# Patient Record
Sex: Female | Born: 1995
Health system: Southern US, Community
[De-identification: ages and names within clinical notes are randomized; demographics above are authoritative.]

## PROBLEM LIST (undated history)

## (undated) ENCOUNTER — Ambulatory Visit (HOSPITAL_BASED_OUTPATIENT_CLINIC_OR_DEPARTMENT_OTHER): Admission: EM | Payer: MEDICAID

## (undated) DIAGNOSIS — F1991 Other psychoactive substance use, unspecified, in remission: Secondary | ICD-10-CM

## (undated) DIAGNOSIS — B1920 Unspecified viral hepatitis C without hepatic coma: Secondary | ICD-10-CM

## (undated) DIAGNOSIS — H9190 Unspecified hearing loss, unspecified ear: Secondary | ICD-10-CM

## (undated) HISTORY — DX: Other psychoactive substance use, unspecified, in remission: F19.91

## (undated) HISTORY — PX: APPENDECTOMY: SHX54

## (undated) HISTORY — DX: Unspecified hearing loss, unspecified ear: H91.90

## (undated) HISTORY — PX: MIDDLE EAR SURGERY: SHX713

## (undated) HISTORY — DX: Unspecified viral hepatitis C without hepatic coma: B19.20

---
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
## 2020-11-12 ENCOUNTER — Other Ambulatory Visit: Payer: Self-pay

## 2020-11-12 ENCOUNTER — Ambulatory Visit: Payer: Medicaid Other | Admitting: *Deleted

## 2020-11-12 VITALS — BP 110/64 | Wt 149.8 lb

## 2020-11-12 DIAGNOSIS — R87610 Atypical squamous cells of undetermined significance on cytologic smear of cervix (ASC-US): Secondary | ICD-10-CM

## 2020-11-12 DIAGNOSIS — Z1239 Encounter for other screening for malignant neoplasm of breast: Secondary | ICD-10-CM

## 2020-11-12 NOTE — Patient Instructions (Signed)
Explained breast self awareness with [name]. Patient did not need a Pap smear today due to last Pap smear was 09/30/2020. Explained the colposcopy the recommended follow-up for her abnormal Pap smear. Referred patient to the [facility] for [facility] for a colposcopy to follow-up for her abnormal Pap smear. Appointment scheduled Monday, December 09, 2020 at 1315. Patient aware of appointment and will be there. Let patient know a screening mammogram is recommended at age 25 unless clinically indicated prior. Discussed smoking cessation with patient. Referred to the [facility] Quitline and gave resources to the free smoking cessation classes at [facility]. Nicole Howe verbalized understanding.  Chinita Schimpf, Kathaleen Maser, RN 3:13 PM

## 2020-11-12 NOTE — Progress Notes (Signed)
Nicole Howe is a 25 y.o. female who presents to [facility] clinic today with no complaints. Patient referred to BCCCP by the [facility] Department due to having an abnormal Pap smear 09/30/2020 that a colposcopy is recommended for follow-up.   Pap Smear: Pap smear not completed today. Last Pap smear was 09/30/2020 at the [facility] clinic and was ASCUS with positive HPV. Per patient has history of an abnormal Pap smear in November 2020 that was ASCUS with positive HPV that a follow-up Pap smear in one year was completed that was patients most recent Pap smear. Last Pap smear result is available in Epic.   Physical exam: Breasts Breasts symmetrical. No skin abnormalities bilateral breasts. No nipple retraction bilateral breasts. No nipple discharge bilateral breasts. No lymphadenopathy. No lumps palpated bilateral breasts. No complaints of pain or tenderness on exam. Screening mammogram recommended at age 73 unless clinically indicated prior.       Pelvic/Bimanual Pap is not indicated today per BCCCP guidelines.    Smoking History: Patient is a current smoker. Discussed smoking cessation with patient. Referred to the [facility] Quitline and gave resources to the free smoking cessation classes at [facility].   Patient Navigation: Patient education provided. Access to services provided for patient through BCCCP program.   Breast and Cervical Cancer Risk Assessment: Patient does not have family history of breast cancer, known genetic mutations, or radiation treatment to the chest before age 34. Patient does not have history of cervical dysplasia, immunocompromised, or DES exposure in-utero. Breast cancer risk assessment completed. No breast cancer risk calculated due to patient is less than 61 years old.  Risk Assessment    Risk Scores      11/12/2020   Last edited by: Priscille Heidelberg, RN   5-year risk:    Lifetime risk:           A: BCCCP exam without pap  smear No complaints.  P: Referred patient to the [facility] for [facility] for a colposcopy to follow-up for her abnormal Pap smear. Appointment scheduled Monday, December 09, 2020 at 1315.  Priscille Heidelberg, RN 11/12/2020 3:13 PM

## 2020-12-09 ENCOUNTER — Other Ambulatory Visit: Payer: Self-pay

## 2020-12-09 ENCOUNTER — Encounter: Payer: Self-pay | Admitting: Family Medicine

## 2020-12-09 ENCOUNTER — Ambulatory Visit (INDEPENDENT_AMBULATORY_CARE_PROVIDER_SITE_OTHER): Payer: Medicaid Other | Admitting: Family Medicine

## 2020-12-09 ENCOUNTER — Other Ambulatory Visit (HOSPITAL_COMMUNITY)
Admission: RE | Admit: 2020-12-09 | Discharge: 2020-12-09 | Disposition: A | Discharge status: Home / Self Care | Payer: Medicaid Other | Source: Ambulatory Visit | Attending: Family Medicine | Admitting: Family Medicine

## 2020-12-09 VITALS — BP 119/79 | HR 66 | Wt 154.7 lb

## 2020-12-09 DIAGNOSIS — R87610 Atypical squamous cells of undetermined significance on cytologic smear of cervix (ASC-US): Secondary | ICD-10-CM | POA: Insufficient documentation

## 2020-12-09 DIAGNOSIS — R87810 Cervical high risk human papillomavirus (HPV) DNA test positive: Secondary | ICD-10-CM | POA: Insufficient documentation

## 2020-12-09 DIAGNOSIS — Z3202 Encounter for pregnancy test, result negative: Secondary | ICD-10-CM

## 2020-12-09 LAB — POCT PREGNANCY, URINE: Preg Test, Ur: NEGATIVE

## 2020-12-09 NOTE — Progress Notes (Signed)
    GYNECOLOGY OFFICE COLPOSCOPY PROCEDURE NOTE  25 y.o. No obstetric history on file. here for colposcopy for ASCUS with POSITIVE high risk HPV pap smear on 09/30/2020 and prior ACUS HPV in 06/2019. Marland Kitchen Discussed role for HPV in cervical dysplasia, need for surveillance.  Patient gave informed written consent, time out was performed.  Placed in lithotomy position. Cervix viewed with speculum and colposcope after application of acetic acid.   Colposcopy adequate? Yes  acetowhite lesion(s) noted circumferentially around os, most dense at 9 and 12 o'clock; corresponding biopsies obtained.  ECC specimen obtained. All specimens were labeled and sent to pathology.  Patient was given post procedure instructions.  Will follow up pathology and manage accordingly; patient will be contacted with results and recommendations.  Routine preventative health maintenance measures emphasized.   Orders Placed This Encounter  Procedures  . Pregnancy, urine POC    Federico Flake, MD, MPH, ABFM, [facility] Attending Physician Center for [facility]

## 2020-12-11 LAB — SURGICAL PATHOLOGY

## 2020-12-16 ENCOUNTER — Telehealth: Payer: Self-pay

## 2020-12-16 ENCOUNTER — Telehealth: Payer: Self-pay | Admitting: General Practice

## 2020-12-16 NOTE — Telephone Encounter (Signed)
-----   Message from Federico Flake, MD sent at 12/16/2020  9:36 AM EDT ----- No dysplasia.  Needs repeat pap in 1 year with HPV cotesting.

## 2020-12-16 NOTE — Telephone Encounter (Signed)
Left detailed message with negative results per Dr Alvester Morin. Pt advised to return call to office with any questions or concerns.   Judeth Cornfield, RN  12/16/20

## 2020-12-16 NOTE — Telephone Encounter (Signed)
Patient called and left message on nurse voicemail stating she is returning our phone call for results. Called patient, no answer- left message to call us back for results.

## 2020-12-17 NOTE — Telephone Encounter (Signed)
Returned patients call. Patient did not answer. LM for patient to call the office for results at her convenience.   Letter created and mailed to patient.

## 2024-04-20 DIAGNOSIS — F1111 Opioid abuse, in remission: Secondary | ICD-10-CM | POA: Insufficient documentation

## 2024-04-21 DIAGNOSIS — R7689 Other specified abnormal immunological findings in serum: Secondary | ICD-10-CM | POA: Insufficient documentation

## 2024-04-26 LAB — PANORAMA PRENATAL TEST FULL PANEL:PANORAMA TEST PLUS 5 ADDITIONAL MICRODELETIONS: FETAL FRACTION: 10.9

## 2024-04-27 ENCOUNTER — Other Ambulatory Visit (HOSPITAL_BASED_OUTPATIENT_CLINIC_OR_DEPARTMENT_OTHER): Payer: Self-pay

## 2024-04-27 ENCOUNTER — Encounter (HOSPITAL_BASED_OUTPATIENT_CLINIC_OR_DEPARTMENT_OTHER): Payer: Self-pay

## 2024-04-27 ENCOUNTER — Ambulatory Visit (HOSPITAL_BASED_OUTPATIENT_CLINIC_OR_DEPARTMENT_OTHER)
Admission: EM | Admit: 2024-04-27 | Discharge: 2024-04-27 | Disposition: A | Discharge status: Home / Self Care | Payer: MEDICAID | Attending: Family Medicine | Admitting: Family Medicine

## 2024-04-27 DIAGNOSIS — K047 Periapical abscess without sinus: Secondary | ICD-10-CM

## 2024-04-27 MED ORDER — CHLORHEXIDINE GLUCONATE 0.12 % MT SOLN
15.0000 mL | Freq: Two times a day (BID) | OROMUCOSAL | 0 refills | Status: AC
  Filled 2024-04-27: qty 473, 16d supply, fill #0

## 2024-04-27 MED ORDER — AMOXICILLIN 875 MG PO TABS
875.0000 mg | ORAL_TABLET | Freq: Two times a day (BID) | ORAL | 0 refills | Status: AC
  Filled 2024-04-27: qty 14, 7d supply, fill #0

## 2024-04-27 NOTE — ED Triage Notes (Signed)
 Pt has several cavities in mouth and for past 2 years she's been getting them extracted, she has a tooth that broke off over a year ago on the bottom left side. Pt has been experiencing worsening pain since Friday but can't get into see a dentist till next week to see a dentist. She is concerned about an infection, particularly due to being [redacted] weeks pregnant. Pt took tylenol two days ago with moderate relief.

## 2024-04-27 NOTE — ED Provider Notes (Signed)
 Nicole Howe    CSN: [id number] Arrival date & time: 04/27/24  1249      History   Chief Complaint Chief Complaint  Patient presents with   Dental Pain   Oral Swelling    HPI Nicole Howe is a 28 y.o. female.   Patient is a 28 year old female who presents today with dental pain.  Pt has several cavities in mouth and for past 2 years she's been getting them extracted. She has a tooth that broke off over a year ago on the bottom left side. Pt has been experiencing worsening pain since Friday but can't get into see a dentist till next week to see a dentist. She is concerned about an infection, particularly due to being [redacted] weeks pregnant. Pt took tylenol two days ago with moderate relief.     Dental Pain   Past Medical History:  Diagnosis Date   Hearing loss     Patient Active Problem List   Diagnosis Date Noted   ASCUS with positive high risk HPV cervical 12/09/2020    Past Surgical History:  Procedure Laterality Date   APPENDECTOMY     MIDDLE EAR SURGERY      OB History     Gravida  3   Para  0   Term  0   Preterm  0   AB  2   Living  0      SAB  2   IAB  0   Ectopic  0   Multiple  0   Live Births  0            Home Medications    Prior to Admission medications   Medication Sig Start Date End Date Taking? Authorizing Provider  amoxicillin  (AMOXIL ) 875 MG tablet Take 1 tablet (875 mg total) by mouth 2 (two) times daily for 7 days. 04/27/24 05/04/24 Yes Regan Mcbryar A, FNP  chlorhexidine  (PERIDEX ) 0.12 % solution Use as directed (rinse and spit) 15 mLs in the mouth or throat 2 (two) times daily. 04/27/24  Yes Adah Wilbert LABOR, FNP    Family History Family History  Problem Relation Age of Onset   Hypertension Mother    Cervical cancer Maternal Aunt     Social History Social History   Tobacco Use   Smoking status: Former   Smokeless tobacco: Never   Tobacco comments:    2 cigarettes per day/trying to quit  Vaping Use    Vaping status: Never Used  Substance Use Topics   Alcohol use: Not Currently   Drug use: Not Currently     Allergies   Patient has no known allergies.   Review of Systems Review of Systems  See HPI Physical Exam Triage Vital Signs ED Triage Vitals  Encounter Vitals Group     BP 04/27/24 1317 138/86     Girls Systolic BP Percentile --      Girls Diastolic BP Percentile --      Boys Systolic BP Percentile --      Boys Diastolic BP Percentile --      Pulse Rate 04/27/24 1317 (!) 56     Resp 04/27/24 1317 20     Temp 04/27/24 1317 98.2 F (36.8 C)     Temp src --      SpO2 04/27/24 1317 98 %     Weight --      Height --      Head Circumference --      Peak  Flow --      Pain Score 04/27/24 1320 6     Pain Loc --      Pain Education --      Exclude from Growth Chart --    No data found.  Updated Vital Signs BP 138/86 (BP Location: Right Arm)   Pulse (!) 56   Temp 98.2 F (36.8 C)   Resp 20   SpO2 98%   Visual Acuity Right Eye Distance:   Left Eye Distance:   Bilateral Distance:    Right Eye Near:   Left Eye Near:    Bilateral Near:     Physical Exam Vitals and nursing note reviewed.  Constitutional:      General: She is not in acute distress.    Appearance: Normal appearance. She is not ill-appearing, toxic-appearing or diaphoretic.  HENT:     Mouth/Throat:     Dentition: Dental tenderness, gingival swelling, dental caries and dental abscesses present.  Pulmonary:     Effort: Pulmonary effort is normal.  Neurological:     Mental Status: She is alert.  Psychiatric:        Mood and Affect: Mood normal.      UC Treatments / Results  Labs (all labs ordered are listed, but only abnormal results are displayed) Labs Reviewed - No data to display  EKG   Radiology No results found.  Procedures Procedures (including critical Howe time)  Medications Ordered in UC Medications - No data to display  Initial Impression / Assessment and Plan / UC  Course  I have reviewed the triage vital signs and the nursing notes.  Pertinent labs & imaging results that were available during my Howe of the patient were reviewed by me and considered in my medical decision making (see chart for details).     Dental infection- treating with Amoxicillin . Peridex  mouthwash as prescribed.  See dentist for F/U as planned and extractions.  Final Clinical Impressions(s) / UC Diagnoses   Final diagnoses:  Dental infection     Discharge Instructions      Treating you for a dental infection.  Antibiotics as prescribed.  Mouthwash as prescribed.  Follow-up with a dentist as planned   ED Prescriptions     Medication Sig Dispense Auth. Provider   amoxicillin  (AMOXIL ) 875 MG tablet Take 1 tablet (875 mg total) by mouth 2 (two) times daily for 7 days. 14 tablet Pallie Swigert A, FNP   chlorhexidine  (PERIDEX ) 0.12 % solution Use as directed (rinse and spit) 15 mLs in the mouth or throat 2 (two) times daily. 473 mL Adah Corning A, FNP      PDMP not reviewed this encounter.   Adah Corning LABOR, FNP 04/28/24 1549

## 2024-04-27 NOTE — Discharge Instructions (Addendum)
 Treating you for a dental infection.  Antibiotics as prescribed.  Mouthwash as prescribed.  Follow-up with a dentist as planned

## 2024-05-08 ENCOUNTER — Ambulatory Visit (HOSPITAL_BASED_OUTPATIENT_CLINIC_OR_DEPARTMENT_OTHER): Payer: Self-pay | Admitting: Family Medicine

## 2024-05-22 DIAGNOSIS — B1920 Unspecified viral hepatitis C without hepatic coma: Secondary | ICD-10-CM | POA: Insufficient documentation

## 2024-05-29 ENCOUNTER — Ambulatory Visit (INDEPENDENT_AMBULATORY_CARE_PROVIDER_SITE_OTHER): Payer: MEDICAID | Admitting: Student

## 2024-05-29 ENCOUNTER — Encounter (HOSPITAL_BASED_OUTPATIENT_CLINIC_OR_DEPARTMENT_OTHER): Payer: Self-pay | Admitting: Student

## 2024-05-29 VITALS — BP 122/75 | HR 63 | Temp 98.4°F | Resp 16 | Ht 63.78 in | Wt 168.2 lb

## 2024-05-29 DIAGNOSIS — Z3A16 16 weeks gestation of pregnancy: Secondary | ICD-10-CM | POA: Insufficient documentation

## 2024-05-29 DIAGNOSIS — O98412 Viral hepatitis complicating pregnancy, second trimester: Secondary | ICD-10-CM

## 2024-05-29 DIAGNOSIS — F1111 Opioid abuse, in remission: Secondary | ICD-10-CM | POA: Insufficient documentation

## 2024-05-29 DIAGNOSIS — R300 Dysuria: Secondary | ICD-10-CM | POA: Insufficient documentation

## 2024-05-29 DIAGNOSIS — B189 Chronic viral hepatitis, unspecified: Secondary | ICD-10-CM | POA: Insufficient documentation

## 2024-05-29 DIAGNOSIS — Z130 Encounter for screening for diseases of the blood and blood-forming organs and certain disorders involving the immune mechanism: Secondary | ICD-10-CM

## 2024-05-29 DIAGNOSIS — Z7689 Persons encountering health services in other specified circumstances: Secondary | ICD-10-CM

## 2024-05-29 DIAGNOSIS — R42 Dizziness and giddiness: Secondary | ICD-10-CM | POA: Diagnosis not present

## 2024-05-29 LAB — POCT URINALYSIS DIP (CLINITEK)
Bilirubin, UA: NEGATIVE
Blood, UA: NEGATIVE
Glucose, UA: NEGATIVE mg/dL
Ketones, POC UA: NEGATIVE mg/dL
Leukocytes, UA: NEGATIVE
Nitrite, UA: NEGATIVE
POC PROTEIN,UA: NEGATIVE
Spec Grav, UA: >=1.03 — AB (ref 1.010–1.025)
Urobilinogen, UA: 0.2 U/dL
pH, UA: 6 (ref 5.0–8.0)

## 2024-05-29 NOTE — Patient Instructions (Signed)
 It was nice to see you today!  If you have any problems before your next visit feel free to message me via MyChart (minor issues or questions) or call the office, otherwise you may reach out to schedule an office visit.  Thank you! Pau Banh, PA-C

## 2024-05-29 NOTE — Progress Notes (Signed)
 New Patient Office Visit  Subjective    Patient ID: Nicole Howe, female    DOB: Dec 12, 1995  Age: 28 y.o. MRN: [id number]  CC:  Chief Complaint  Patient presents with   Establish Care    Here to establish care. Is four months pregnant.   UTI    Frequent urination and mild back pain.    Dizziness    Has also felt light headed since yesterday but it also happened another time, which only lasted 1 day. It is more worrisome than it has been.    Discussed the use of AI scribe software for clinical note transcription with the patient, who gave verbal consent to proceed.  History of Present Illness   Nicole Howe is a 28 year old female who presents for establishment of care with concerns of possible UTI and back pain.  She is four months pregnant with an expected due date of March 23rd and has been following with maternal fetal medicine. She experiences increased urination and lower left-sided back pain, which she suspects might be related to a urinary tract infection (UTI). A recent urinalysis showed trace leukocyte esterase. She has not taken any medication for the pain but has increased her water intake. The back pain was more severe last week, prompting her to make an appointment, but it has decreased since then.  She has a history of hepatitis C, which is currently dormant. 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 She has been off methadone since April 7th, two years ago. She mentions previous miscarriages but has not experienced any bleeding during this pregnancy.  She reports episodes of lightheadedness and heart racing when changing positions, such as standing up from lying down. These episodes are brief, lasting less than a minute, and have occurred twice during her pregnancy.  She experiences cravings for ice, which she attributes to feeling dehydrated. She is taking prenatal vitamins and is cautious about her intake due to  her recovery. She monitors her caffeine intake, ensuring it does not exceed 200 mg per day. No abnormal discharge or foul smell.      Outpatient Encounter Medications as of 05/29/2024  Medication Sig   chlorhexidine  (PERIDEX ) 0.12 % solution Use as directed (rinse and spit) 15 mLs in the mouth or throat 2 (two) times daily.   Prenatal Vit-DSS-Fe Cbn-FA (PRENATAL AD PO) Take by mouth. Olli chewable- 2 daily   No facility-administered encounter medications on file as of 05/29/2024.    Past Medical History:  Diagnosis Date   Acute appendicitis 04/07/2013   Hearing loss    Hepatitis C    History of drug use     Past Surgical History:  Procedure Laterality Date   APPENDECTOMY     MIDDLE EAR SURGERY     x13    Family History  Problem Relation Age of Onset   Hypertension Mother    Hearing loss Mother    Cervical cancer Maternal Aunt    Mental illness Half-Brother    Ovarian cysts Half-Sister     Social History   Socioeconomic History   Marital status: Married    Spouse name: Not on file   Number of children: 3   Years of education: Not on file   Highest education level: High school graduate  Occupational History   Not on file  Tobacco Use   Smoking status: Former    Current packs/day: 0.00    Average  packs/day: 0.3 packs/day for 15.6 years (3.9 ttl pk-yrs)    Types: Cigarettes    Start date: 2010    Quit date: 03/21/2024    Years since quitting: 0.1    Passive exposure: Past   Smokeless tobacco: Never  Vaping Use   Vaping status: Never Used  Substance and Sexual Activity   Alcohol use: Not Currently   Drug use: Not Currently    Comment: QUIT LESS THAN 30 DAYS 05/29/2024, WAS SMOKING THC   Sexual activity: Yes    Birth control/protection: None  Other Topics Concern   Not on file  Social History Narrative   Not on file   Social Drivers of Health   Financial Resource Strain: Not on file  Food Insecurity: No Food Insecurity (05/29/2024)   Hunger Vital Sign     Worried About Running Out of Food in the Last Year: Never true    Ran Out of Food in the Last Year: Never true  Transportation Needs: No Transportation Needs (05/29/2024)   PRAPARE - Administrator, Civil Service (Medical): No    Lack of Transportation (Non-Medical): No  Physical Activity: Not on file  Stress: Not on file  Social Connections: Not on file  Intimate Partner Violence: Not At Risk (05/29/2024)   Humiliation, Afraid, Rape, and Kick questionnaire    Fear of Current or Ex-Partner: No    Emotionally Abused: No    Physically Abused: No    Sexually Abused: No    ROS  Per HPI      Objective    BP 122/75   Pulse 63   Temp 98.4 F (36.9 C) (Oral)   Resp 16   Ht 5' 3.78 (1.62 m)   Wt 168 lb 3.2 oz (76.3 kg)   LMP 02/10/2024 (Approximate)   SpO2 98%   BMI 29.07 kg/m   Physical Exam Constitutional:      General: She is not in acute distress.    Appearance: Normal appearance. She is not ill-appearing.  HENT:     Head: Normocephalic and atraumatic.     Nose: Nose normal.  Eyes:     General: No scleral icterus.    Conjunctiva/sclera: Conjunctivae normal.  Cardiovascular:     Rate and Rhythm: Normal rate and regular rhythm.     Heart sounds: Normal heart sounds. No murmur heard.    No friction rub.  Pulmonary:     Effort: Pulmonary effort is normal. No respiratory distress.     Breath sounds: Normal breath sounds. No wheezing, rhonchi or rales.  Musculoskeletal:        General: Normal range of motion.  Skin:    General: Skin is warm and dry.     Coloration: Skin is not jaundiced or pale.  Neurological:     Mental Status: She is alert.  Psychiatric:        Mood and Affect: Mood normal.        Behavior: Behavior normal.      No results found for: HGBA1C      Assessment & Plan:   Assessment and Plan    Establishment of Care  Pregnancy, second trimester Currently in the second trimester with a due date of March 23rd. No significant  complications. Reports increased urination and back pain, likely related to pregnancy. Episodes of lightheadedness and heart racing upon standing suggest orthostatic hypotension. Symptoms are transient and resolve within a minute. Blood pressure is normal. - Encourage increased water intake  Suspected urinary  tract infection in pregnancy- dysuria Suspected UTI due to increased urination and back pain. Initial urinalysis negative, but previous urinalysis showed trace leukocyte esterase. Culture pending to confirm diagnosis and guide treatment. - Send urine for culture - Await culture results to determine need for antibiotics  Screening for anemia in pregnancy/lightheadedness Reports symptoms suggestive of possible anemia, including craving ice and feeling cold. No recent anemia screening noted. No alarm symptoms. Discussed that she should follow back up if this continues. - Order CBC and iron panel  Chronic hepatitis C infection in pregnancy Chronic hepatitis C infection identified. Treatment deferred until postpartum due to pregnancy. Monitoring liver function to assess any damage. No current symptoms reported. 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 Previously used methadone for treatment, now off methadone for nearly two years. No current substance use reported.      Return in about 28 weeks (around 12/11/2024), or if symptoms worsen or fail to improve.   Latriece Anstine T Eldrige Pitkin, PA-C

## 2024-05-30 LAB — CBC WITH DIFFERENTIAL/PLATELET
Basophils Absolute: 0 x10E3/uL (ref 0.0–0.2)
Basos: 0 %
EOS (ABSOLUTE): 0.1 x10E3/uL (ref 0.0–0.4)
Eos: 1 %
Hematocrit: 39.8 % (ref 34.0–46.6)
Hemoglobin: 13 g/dL (ref 11.1–15.9)
Immature Grans (Abs): 0 x10E3/uL (ref 0.0–0.1)
Immature Granulocytes: 0 %
Lymphocytes Absolute: 4 x10E3/uL — ABNORMAL HIGH (ref 0.7–3.1)
Lymphs: 33 %
MCH: 30.4 pg (ref 26.6–33.0)
MCHC: 32.7 g/dL (ref 31.5–35.7)
MCV: 93 fL (ref 79–97)
Monocytes Absolute: 0.8 x10E3/uL (ref 0.1–0.9)
Monocytes: 6 %
Neutrophils Absolute: 7.3 x10E3/uL — ABNORMAL HIGH (ref 1.4–7.0)
Neutrophils: 60 %
Platelets: 192 x10E3/uL (ref 150–450)
RBC: 4.28 x10E6/uL (ref 3.77–5.28)
RDW: 13.6 % (ref 11.7–15.4)
WBC: 12.3 x10E3/uL — ABNORMAL HIGH (ref 3.4–10.8)

## 2024-05-30 LAB — IRON,TIBC AND FERRITIN PANEL
Ferritin: 139 ng/mL (ref 15–150)
Iron Saturation: 28 % (ref 15–55)
Iron: 81 ug/dL (ref 27–159)
Total Iron Binding Capacity: 289 ug/dL (ref 250–450)
UIBC: 208 ug/dL (ref 131–425)

## 2024-05-31 ENCOUNTER — Ambulatory Visit (HOSPITAL_BASED_OUTPATIENT_CLINIC_OR_DEPARTMENT_OTHER): Payer: Self-pay | Admitting: Student

## 2024-05-31 LAB — URINE CULTURE

## 2024-07-10 ENCOUNTER — Encounter (HOSPITAL_BASED_OUTPATIENT_CLINIC_OR_DEPARTMENT_OTHER): Payer: Self-pay

## 2024-07-10 ENCOUNTER — Ambulatory Visit (HOSPITAL_BASED_OUTPATIENT_CLINIC_OR_DEPARTMENT_OTHER)
Admission: EM | Admit: 2024-07-10 | Discharge: 2024-07-10 | Disposition: A | Discharge status: Home / Self Care | Payer: MEDICAID | Attending: Family Medicine | Admitting: Family Medicine

## 2024-07-10 ENCOUNTER — Other Ambulatory Visit (HOSPITAL_BASED_OUTPATIENT_CLINIC_OR_DEPARTMENT_OTHER): Payer: Self-pay

## 2024-07-10 DIAGNOSIS — J0110 Acute frontal sinusitis, unspecified: Secondary | ICD-10-CM

## 2024-07-10 DIAGNOSIS — R051 Acute cough: Secondary | ICD-10-CM | POA: Diagnosis not present

## 2024-07-10 LAB — POC COVID19/FLU A&B COMBO
Covid Antigen, POC: NEGATIVE
Influenza A Antigen, POC: NEGATIVE
Influenza B Antigen, POC: NEGATIVE

## 2024-07-10 MED ORDER — AMOXICILLIN 875 MG PO TABS
875.0000 mg | ORAL_TABLET | Freq: Two times a day (BID) | ORAL | 0 refills | Status: AC
  Filled 2024-07-10: qty 14, 7d supply, fill #0

## 2024-07-10 NOTE — ED Provider Notes (Signed)
 [facility]    CSN: [id number] Arrival date & time: 07/10/24  1142      History   Chief Complaint Chief Complaint  Patient presents with   Cough   Fever    HPI Nicole Howe is a 28 y.o. female.   Pt c/o fever,cough ,shortness of breath, nasal congestion, bilateral ear pain since Saturday. Pt reports fever of 102.1  Pt is currently 5 months pregnant. Pt has taken tylenol and benadryl for current symptoms.    Cough Associated symptoms: fever   Fever Associated symptoms: cough     Past Medical History:  Diagnosis Date   Acute appendicitis 04/07/2013   Hearing loss    Hepatitis C    History of drug use     Patient Active Problem List   Diagnosis Date Noted   Chronic viral hepatitis complicating pregnancy in second trimester (HCC) 05/29/2024   Opioid abuse, in remission (HCC) 05/29/2024   Dizziness 05/29/2024   Dysuria 05/29/2024   [redacted] weeks gestation of pregnancy 05/29/2024   Hepatitis C virus infection in mother during pregnancy 05/22/2024   Hepatitis C antibody positive 04/21/2024   History of opioid abuse (HCC) 04/20/2024   ASCUS with positive high risk HPV cervical 12/09/2020    Past Surgical History:  Procedure Laterality Date   APPENDECTOMY     MIDDLE EAR SURGERY     x13    OB History     Gravida  3   Para  0   Term  0   Preterm  0   AB  2   Living  0      SAB  2   IAB  0   Ectopic  0   Multiple  0   Live Births  0            Home Medications    Prior to Admission medications   Medication Sig Start Date End Date Taking? Authorizing Provider  acetaminophen (TYLENOL) 325 MG tablet Take 650 mg by mouth.   Yes [provider]  amoxicillin  (AMOXIL ) 875 MG tablet Take 1 tablet (875 mg total) by mouth 2 (two) times daily for 7 days. 07/10/24 07/17/24 Yes Verdis Koval A, FNP  Prenatal Vit-DSS-Fe Cbn-FA (PRENATAL AD PO) Take by mouth. Olli chewable- 2 daily   Yes [provider]  chlorhexidine   (PERIDEX ) 0.12 % solution Use as directed (rinse and spit) 15 mLs in the mouth or throat 2 (two) times daily. 04/27/24   Adah Wilbert LABOR, FNP    Family History Family History  Problem Relation Age of Onset   Hypertension Mother    Hearing loss Mother    Cervical cancer Maternal Aunt    Mental illness Half-Brother    Ovarian cysts Half-Sister     Social History Social History   Tobacco Use   Smoking status: Former    Current packs/day: 0.00    Average packs/day: 0.3 packs/day for 15.6 years (3.9 ttl pk-yrs)    Types: Cigarettes    Start date: 2010    Quit date: 03/21/2024    Years since quitting: 0.3    Passive exposure: Past   Smokeless tobacco: Never  Vaping Use   Vaping status: Never Used  Substance Use Topics   Alcohol use: Not Currently   Drug use: Not Currently    Comment: QUIT LESS THAN 30 DAYS 05/29/2024, WAS SMOKING THC     Allergies   Patient has no known allergies.   Review of Systems  Review of Systems  Constitutional:  Positive for fever.  Respiratory:  Positive for cough.      Physical Exam Triage Vital Signs ED Triage Vitals  Encounter Vitals Group     BP 07/10/24 1242 112/71     Girls Systolic BP Percentile --      Girls Diastolic BP Percentile --      Boys Systolic BP Percentile --      Boys Diastolic BP Percentile --      Pulse Rate 07/10/24 1242 76     Resp 07/10/24 1242 18     Temp 07/10/24 1242 97.7 F (36.5 C)     Temp Source 07/10/24 1242 Oral     SpO2 07/10/24 1242 98 %     Weight --      Height --      Head Circumference --      Peak Flow --      Pain Score 07/10/24 1239 3     Pain Loc --      Pain Education --      Exclude from Growth Chart --    No data found.  Updated Vital Signs BP 112/71 (BP Location: Right Arm)   Pulse 76   Temp 97.7 F (36.5 C) (Oral)   Resp 18   LMP 02/10/2024 (Approximate)   SpO2 98%   Visual Acuity Right Eye Distance:   Left Eye Distance:   Bilateral Distance:    Right Eye Near:   Left  Eye Near:    Bilateral Near:     Physical Exam Constitutional:      General: She is not in acute distress.    Appearance: Normal appearance. She is not ill-appearing, toxic-appearing or diaphoretic.  HENT:     Head: Normocephalic and atraumatic.     Right Ear: Tympanic membrane and ear canal normal.     Left Ear: Tympanic membrane and ear canal normal.     Nose: Congestion and rhinorrhea present.     Mouth/Throat:     Pharynx: Oropharynx is clear.  Eyes:     Conjunctiva/sclera: Conjunctivae normal.  Cardiovascular:     Rate and Rhythm: Normal rate and regular rhythm.     Pulses: Normal pulses.     Heart sounds: Normal heart sounds.  Pulmonary:     Effort: Pulmonary effort is normal.     Breath sounds: Normal breath sounds.  Skin:    General: Skin is warm and dry.  Neurological:     Mental Status: She is alert.  Psychiatric:        Mood and Affect: Mood normal.      UC Treatments / Results  Labs (all labs ordered are listed, but only abnormal results are displayed) Labs Reviewed  POC COVID19/FLU A&B COMBO - Normal    EKG   Radiology No results found.  Procedures Procedures (including critical care time)  Medications Ordered in UC Medications - No data to display  Initial Impression / Assessment and Plan / UC Course  I have reviewed the triage vital signs and the nursing notes.  Pertinent labs & imaging results that were available during my care of the patient were reviewed by me and considered in my medical decision making (see chart for details).     Acute cough with sinusitis-treating for bacterial sinus infection at this time.  Treat with amoxicillin .  Recommend over-the-counter Tylenol for pain as needed and plain Mucinex for congestion.  Make sure you are drinking lots of fluids.  Follow-up as needed Final Clinical Impressions(s) / UC Diagnoses   Final diagnoses:  Acute cough  Acute non-recurrent frontal sinusitis     Discharge Instructions       Take the antibiotics as prescribed for sinus infection.  You can do over-the-counter Tylenol and plain Mucinex for other symptoms.  Make sure you are drinking lots of fluids.  Follow-up as needed    ED Prescriptions     Medication Sig Dispense Auth. Provider   amoxicillin  (AMOXIL ) 875 MG tablet Take 1 tablet (875 mg total) by mouth 2 (two) times daily for 7 days. 14 tablet Adah Wilbert LABOR, FNP      PDMP not reviewed this encounter.   Adah Wilbert LABOR, FNP 07/10/24 1759

## 2024-07-10 NOTE — Discharge Instructions (Signed)
 Take the antibiotics as prescribed for sinus infection.  You can do over-the-counter Tylenol and plain Mucinex for other symptoms.  Make sure you are drinking lots of fluids.  Follow-up as needed

## 2024-07-10 NOTE — ED Triage Notes (Addendum)
 Pt c/o fever,cough ,shortness of breathe, nasal congestion, bilateral ear pain since Saturday. Pt reports fever of 102.1  Pt is currently 5 months pregnant. Pt has taken tylenol and benadryl for current symptoms.

## 2024-07-26 ENCOUNTER — Encounter (HOSPITAL_BASED_OUTPATIENT_CLINIC_OR_DEPARTMENT_OTHER): Payer: Self-pay

## 2024-07-26 ENCOUNTER — Ambulatory Visit (HOSPITAL_BASED_OUTPATIENT_CLINIC_OR_DEPARTMENT_OTHER)
Admission: EM | Admit: 2024-07-26 | Discharge: 2024-07-26 | Disposition: A | Discharge status: Home / Self Care | Payer: MEDICAID | Attending: Family Medicine | Admitting: Family Medicine

## 2024-07-26 ENCOUNTER — Other Ambulatory Visit (HOSPITAL_BASED_OUTPATIENT_CLINIC_OR_DEPARTMENT_OTHER): Payer: Self-pay

## 2024-07-26 DIAGNOSIS — H9203 Otalgia, bilateral: Secondary | ICD-10-CM

## 2024-07-26 DIAGNOSIS — H66003 Acute suppurative otitis media without spontaneous rupture of ear drum, bilateral: Secondary | ICD-10-CM

## 2024-07-26 DIAGNOSIS — Z3A24 24 weeks gestation of pregnancy: Secondary | ICD-10-CM

## 2024-07-26 MED ORDER — BUDESONIDE 32 MCG/ACT NA SUSP
1.0000 | Freq: Two times a day (BID) | NASAL | 0 refills | Status: AC | PRN
  Filled 2024-07-26: qty 30, fill #0
  Filled 2024-07-26: qty 8.43, 30d supply, fill #0

## 2024-07-26 MED ORDER — CEFDINIR 300 MG PO CAPS
300.0000 mg | ORAL_CAPSULE | Freq: Two times a day (BID) | ORAL | 0 refills | Status: AC
  Filled 2024-07-26: qty 20, 10d supply, fill #0

## 2024-07-26 NOTE — ED Triage Notes (Signed)
 Pt states she was seen on 07/10/24 for sinus issues. Since then she still has a cough and she is concerned she has an ear infection. She is having bilateral ear fullness and left ear pain. She has not taken anything else besides the amoxicillin . She is currently pregnant.

## 2024-07-26 NOTE — Discharge Instructions (Addendum)
 Bilateral ear infection with ear pain and current pregnancy: Due to pregnancy patient should use Rhinocort AQ, 1 spray into each nostril twice daily for nasal congestion and to open up the eustachian tubes so that they will drain.  Cefdinir 300 mg twice daily for 10 days for the ear infection.  Get plenty of fluids and rest.    Follow-up if symptoms do not improve, worsen or new symptoms occur.  Follow-up with OB/GYN if any change in fetal movement, any vaginal bleeding or any abdominal cramping.

## 2024-07-26 NOTE — ED Provider Notes (Signed)
 [facility]    CSN: [id number] Arrival date & time: 07/26/24  1139      History   Chief Complaint Chief Complaint  Patient presents with   Ear Fullness   Cough    HPI Nicole Howe is a 28 y.o. female.   28 year old female who was seen on 07/10/2024 with with complaint of fever, cough ,shortness of breath, nasal congestion, bilateral ear pain that started on 07/08/2024.  She had a fever as high as 102.1 prior to her visit.  She was and still is pregnant.  She was provided amoxicillin  875 mg twice daily for 7 days for sinusitis as the diagnosis at the visit.  She still has a cough and ear pain with ear fullness.  She is concerned that she has an ear infection.  She did complete the amoxicillin .  She is currently pregnant with an estimated date of delivery (EDD) of 11/13/2024.  She is currently 24 weeks +2 days pregnant.  She is feeling the baby move every day he and she has not had any vaginal bleeding nor vaginal discharge.   Ear Fullness Pertinent negatives include no chest pain, no abdominal pain and no shortness of breath.  Cough Associated symptoms: ear pain and rhinorrhea   Associated symptoms: no chest pain, no chills, no fever, no rash, no shortness of breath and no sore throat     Past Medical History:  Diagnosis Date   Acute appendicitis 04/07/2013   Hearing loss    Hepatitis C    History of drug use     Patient Active Problem List   Diagnosis Date Noted   Chronic viral hepatitis complicating pregnancy in second trimester (HCC) 05/29/2024   Opioid abuse, in remission (HCC) 05/29/2024   Dizziness 05/29/2024   Dysuria 05/29/2024   [redacted] weeks gestation of pregnancy 05/29/2024   Hepatitis C virus infection in mother during pregnancy 05/22/2024   Hepatitis C antibody positive 04/21/2024   History of opioid abuse (HCC) 04/20/2024   ASCUS with positive high risk HPV cervical 12/09/2020    Past Surgical History:  Procedure Laterality Date   APPENDECTOMY      MIDDLE EAR SURGERY     x13    OB History     Gravida  3   Para  0   Term  0   Preterm  0   AB  2   Living  0      SAB  2   IAB  0   Ectopic  0   Multiple  0   Live Births  0            Home Medications    Prior to Admission medications   Medication Sig Start Date End Date Taking? Authorizing Provider  budesonide (RHINOCORT AQUA) 32 MCG/ACT nasal spray Place 1 spray into both nostrils every 12 (twelve) hours as needed for rhinitis (Nasal congestion and ear pressure). 07/26/24  Yes Ival Domino, FNP  cefdinir (OMNICEF) 300 MG capsule Take 1 capsule (300 mg total) by mouth 2 (two) times daily for 10 days. 07/26/24 08/05/24 Yes Ival Domino, FNP  acetaminophen (TYLENOL) 325 MG tablet Take 650 mg by mouth.    [provider]  chlorhexidine  (PERIDEX ) 0.12 % solution Use as directed (rinse and spit) 15 mLs in the mouth or throat 2 (two) times daily. 04/27/24   Adah Wilbert LABOR, FNP  Prenatal Vit-DSS-Fe Cbn-FA (PRENATAL AD PO) Take by mouth. Olli chewable- 2 daily    [provider]    Family History Family History  Problem Relation Age of Onset   Hypertension Mother    Hearing loss Mother    Cervical cancer Maternal Aunt    Mental illness Half-Brother    Ovarian cysts Half-Sister     Social History Social History   Tobacco Use   Smoking status: Former    Current packs/day: 0.00    Average packs/day: 0.3 packs/day for 15.6 years (3.9 ttl pk-yrs)    Types: Cigarettes    Start date: 2010    Quit date: 03/21/2024    Years since quitting: 0.3    Passive exposure: Past   Smokeless tobacco: Never  Vaping Use   Vaping status: Never Used  Substance Use Topics   Alcohol use: Not Currently   Drug use: Not Currently    Comment: QUIT LESS THAN 30 DAYS 05/29/2024, WAS SMOKING THC     Allergies   Patient has no known allergies.   Review of Systems Review of Systems  Constitutional:  Negative for chills and fever.  HENT:  Positive for  congestion, ear pain, postnasal drip and rhinorrhea. Negative for sore throat.   Eyes:  Negative for pain and visual disturbance.  Respiratory:  Positive for cough. Negative for shortness of breath.   Cardiovascular:  Negative for chest pain and palpitations.  Gastrointestinal:  Negative for abdominal pain, constipation, diarrhea, nausea and vomiting.  Genitourinary:  Negative for dysuria and hematuria.  Musculoskeletal:  Negative for arthralgias and back pain.  Skin:  Negative for color change and rash.  Neurological:  Negative for seizures and syncope.  All other systems reviewed and are negative.    Physical Exam Triage Vital Signs ED Triage Vitals  Encounter Vitals Group     BP 07/26/24 1210 106/69     Girls Systolic BP Percentile --      Girls Diastolic BP Percentile --      Boys Systolic BP Percentile --      Boys Diastolic BP Percentile --      Pulse Rate 07/26/24 1210 72     Resp 07/26/24 1210 20     Temp 07/26/24 1210 98.8 F (37.1 C)     Temp Source 07/26/24 1210 Oral     SpO2 07/26/24 1210 98 %     Weight --      Height --      Head Circumference --      Peak Flow --      Pain Score 07/26/24 1209 1     Pain Loc --      Pain Education --      Exclude from Growth Chart --    No data found.  Updated Vital Signs BP 106/69 (BP Location: Right Arm)   Pulse 72   Temp 98.8 F (37.1 C) (Oral)   Resp 20   LMP 02/10/2024 (Approximate)   SpO2 98%   Visual Acuity Right Eye Distance:   Left Eye Distance:   Bilateral Distance:    Right Eye Near:   Left Eye Near:    Bilateral Near:     Physical Exam Vitals and nursing note reviewed.  Constitutional:      General: She is not in acute distress.    Appearance: She is well-developed. She is not ill-appearing or toxic-appearing.  HENT:     Head: Normocephalic and atraumatic.     Right Ear: Hearing, ear canal and external ear normal. A middle ear effusion (Bubbles of serous fluid behind the  tympanic membrane.) is  present. Tympanic membrane is erythematous and bulging.     Left Ear: Hearing, ear canal and external ear normal. A middle ear effusion (Bubbles of serous fluid behind the tympanic membrane but significantly less than on the right side.) is present. Tympanic membrane is bulging. Tympanic membrane is not erythematous.     Nose: No congestion or rhinorrhea.     Right Sinus: No maxillary sinus tenderness or frontal sinus tenderness.     Left Sinus: No maxillary sinus tenderness or frontal sinus tenderness.     Mouth/Throat:     Lips: Pink.     Mouth: Mucous membranes are moist.     Pharynx: Uvula midline. No oropharyngeal exudate or posterior oropharyngeal erythema.     Tonsils: No tonsillar exudate.  Eyes:     Conjunctiva/sclera: Conjunctivae normal.     Pupils: Pupils are equal, round, and reactive to light.  Cardiovascular:     Rate and Rhythm: Normal rate and regular rhythm.     Heart sounds: S1 normal and S2 normal. No murmur heard. Pulmonary:     Effort: Pulmonary effort is normal. No respiratory distress.     Breath sounds: Normal breath sounds. No decreased breath sounds, wheezing, rhonchi or rales.  Abdominal:     General: Bowel sounds are normal.     Palpations: Abdomen is soft.     Tenderness: There is no abdominal tenderness.  Musculoskeletal:        General: No swelling.     Cervical back: Neck supple.  Lymphadenopathy:     Head:     Right side of head: Posterior auricular adenopathy present. No submental, submandibular, tonsillar or preauricular adenopathy.     Left side of head: Preauricular and posterior auricular adenopathy present. No submental, submandibular or tonsillar adenopathy.     Cervical: Cervical adenopathy present.     Right cervical: Superficial cervical adenopathy present.     Left cervical: Superficial cervical adenopathy present.  Skin:    General: Skin is warm and dry.     Capillary Refill: Capillary refill takes less than 2 seconds.     Findings: No  rash.  Neurological:     Mental Status: She is alert and oriented to person, place, and time.  Psychiatric:        Mood and Affect: Mood normal.      UC Treatments / Results  Labs (all labs ordered are listed, but only abnormal results are displayed) Labs Reviewed - No data to display  EKG   Radiology No results found.  Procedures Procedures (including critical care time)  Medications Ordered in UC Medications - No data to display  Initial Impression / Assessment and Plan / UC Course  I have reviewed the triage vital signs and the nursing notes.  Pertinent labs & imaging results that were available during my care of the patient were reviewed by me and considered in my medical decision making (see chart for details).  Plan of Care (see discharge instructions for additional patient precautions and education): Bilateral ear infection with ear pain and current pregnancy: Due to pregnancy patient should use Rhinocort AQ, 1 spray into each nostril twice daily for nasal congestion and to open up the eustachian tubes so that they will drain.  Cefdinir 300 mg twice daily for 10 days for the ear infection.  Get plenty of fluids and rest.   Follow-up if symptoms do not improve, worsen or new symptoms occur.   Follow-up with OB/GYN if any  change in fetal movement, any vaginal bleeding or any abdominal cramping.  I reviewed the plan of care with the patient and/or the patient's guardian.  The patient and/or guardian had time to ask questions and acknowledged that the questions were answered.  Final Clinical Impressions(s) / UC Diagnoses   Final diagnoses:  [redacted] weeks gestation of pregnancy  Non-recurrent acute suppurative otitis media of both ears without spontaneous rupture of tympanic membranes  Otalgia of both ears     Discharge Instructions      Bilateral ear infection with ear pain and current pregnancy: Due to pregnancy patient should use Rhinocort AQ, 1 spray into each  nostril twice daily for nasal congestion and to open up the eustachian tubes so that they will drain.  Cefdinir 300 mg twice daily for 10 days for the ear infection.  Get plenty of fluids and rest.    Follow-up if symptoms do not improve, worsen or new symptoms occur.  Follow-up with OB/GYN if any change in fetal movement, any vaginal bleeding or any abdominal cramping.     ED Prescriptions     Medication Sig Dispense Auth. Provider   budesonide (RHINOCORT AQUA) 32 MCG/ACT nasal spray Place 1 spray into both nostrils every 12 (twelve) hours as needed for rhinitis (Nasal congestion and ear pressure). 30 mL Ival Domino, FNP   cefdinir (OMNICEF) 300 MG capsule Take 1 capsule (300 mg total) by mouth 2 (two) times daily for 10 days. 20 capsule Ival Domino, FNP      PDMP not reviewed this encounter.   Ival Domino, FNP 07/26/24 1240

## 2024-07-27 ENCOUNTER — Other Ambulatory Visit (HOSPITAL_BASED_OUTPATIENT_CLINIC_OR_DEPARTMENT_OTHER): Payer: Self-pay

## 2024-08-07 ENCOUNTER — Other Ambulatory Visit (HOSPITAL_BASED_OUTPATIENT_CLINIC_OR_DEPARTMENT_OTHER): Payer: Self-pay

## 2024-12-11 ENCOUNTER — Ambulatory Visit (HOSPITAL_BASED_OUTPATIENT_CLINIC_OR_DEPARTMENT_OTHER): Payer: MEDICAID | Admitting: Student
